# Patient Record
Sex: Female | Born: 1955 | Race: White | Hispanic: No | Marital: Married | Smoking: Never smoker
Health system: Southern US, Community
[De-identification: ages and names within clinical notes are randomized; demographics above are authoritative.]

## PROBLEM LIST (undated history)

## (undated) DIAGNOSIS — T7840XA Allergy, unspecified, initial encounter: Secondary | ICD-10-CM

## (undated) DIAGNOSIS — Z780 Asymptomatic menopausal state: Secondary | ICD-10-CM

## (undated) DIAGNOSIS — F988 Other specified behavioral and emotional disorders with onset usually occurring in childhood and adolescence: Secondary | ICD-10-CM

## (undated) DIAGNOSIS — E663 Overweight: Secondary | ICD-10-CM

## (undated) DIAGNOSIS — M797 Fibromyalgia: Secondary | ICD-10-CM

## (undated) HISTORY — PX: MYOMECTOMY: SHX85

## (undated) HISTORY — DX: Other specified behavioral and emotional disorders with onset usually occurring in childhood and adolescence: F98.8

## (undated) HISTORY — DX: Overweight: E66.3

## (undated) HISTORY — DX: Asymptomatic menopausal state: Z78.0

## (undated) HISTORY — DX: Allergy, unspecified, initial encounter: T78.40XA

## (undated) HISTORY — DX: Fibromyalgia: M79.7

## (undated) HISTORY — PX: OTHER SURGICAL HISTORY: SHX169

---

## 1999-09-01 ENCOUNTER — Other Ambulatory Visit: Admission: RE | Admit: 1999-09-01 | Discharge: 1999-09-01 | Payer: Self-pay | Admitting: Obstetrics and Gynecology

## 1999-11-06 ENCOUNTER — Encounter: Admission: RE | Admit: 1999-11-06 | Discharge: 1999-11-06 | Payer: Self-pay | Admitting: *Deleted

## 2001-01-19 ENCOUNTER — Other Ambulatory Visit: Admission: RE | Admit: 2001-01-19 | Discharge: 2001-01-19 | Payer: Self-pay | Admitting: Obstetrics and Gynecology

## 2001-01-25 ENCOUNTER — Encounter: Payer: Self-pay | Admitting: Obstetrics and Gynecology

## 2001-01-25 ENCOUNTER — Encounter: Admission: RE | Admit: 2001-01-25 | Discharge: 2001-01-25 | Payer: Self-pay | Admitting: Obstetrics and Gynecology

## 2002-03-22 ENCOUNTER — Encounter: Payer: Self-pay | Admitting: Obstetrics and Gynecology

## 2002-03-22 ENCOUNTER — Encounter: Admission: RE | Admit: 2002-03-22 | Discharge: 2002-03-22 | Payer: Self-pay | Admitting: Obstetrics and Gynecology

## 2002-05-30 ENCOUNTER — Other Ambulatory Visit: Admission: RE | Admit: 2002-05-30 | Discharge: 2002-05-30 | Payer: Self-pay | Admitting: Obstetrics and Gynecology

## 2003-03-14 ENCOUNTER — Ambulatory Visit (HOSPITAL_COMMUNITY): Admission: RE | Admit: 2003-03-14 | Discharge: 2003-03-14 | Payer: Self-pay | Admitting: *Deleted

## 2004-01-21 ENCOUNTER — Encounter: Admission: RE | Admit: 2004-01-21 | Discharge: 2004-01-21 | Payer: Self-pay | Admitting: Obstetrics and Gynecology

## 2004-02-11 ENCOUNTER — Other Ambulatory Visit: Admission: RE | Admit: 2004-02-11 | Discharge: 2004-02-11 | Payer: Self-pay | Admitting: Obstetrics and Gynecology

## 2005-03-25 ENCOUNTER — Encounter: Admission: RE | Admit: 2005-03-25 | Discharge: 2005-03-25 | Payer: Self-pay | Admitting: Obstetrics and Gynecology

## 2005-07-29 ENCOUNTER — Other Ambulatory Visit: Admission: RE | Admit: 2005-07-29 | Discharge: 2005-07-29 | Payer: Self-pay | Admitting: Obstetrics and Gynecology

## 2006-03-29 ENCOUNTER — Encounter: Admission: RE | Admit: 2006-03-29 | Discharge: 2006-03-29 | Payer: Self-pay | Admitting: Obstetrics and Gynecology

## 2007-06-29 ENCOUNTER — Encounter: Admission: RE | Admit: 2007-06-29 | Discharge: 2007-06-29 | Payer: Self-pay | Admitting: Obstetrics and Gynecology

## 2010-06-29 ENCOUNTER — Encounter: Admission: RE | Admit: 2010-06-29 | Discharge: 2010-06-29 | Payer: Self-pay | Admitting: Obstetrics and Gynecology

## 2010-10-18 ENCOUNTER — Encounter: Payer: Self-pay | Admitting: Obstetrics and Gynecology

## 2011-04-08 ENCOUNTER — Ambulatory Visit (INDEPENDENT_AMBULATORY_CARE_PROVIDER_SITE_OTHER): Payer: PRIVATE HEALTH INSURANCE | Admitting: Internal Medicine

## 2011-04-08 ENCOUNTER — Other Ambulatory Visit: Payer: Self-pay | Admitting: Internal Medicine

## 2011-04-08 ENCOUNTER — Encounter: Payer: Self-pay | Admitting: Internal Medicine

## 2011-04-08 VITALS — BP 122/78 | HR 81 | Temp 98.5°F | Resp 12 | Ht 66.75 in | Wt 172.0 lb

## 2011-04-08 DIAGNOSIS — IMO0001 Reserved for inherently not codable concepts without codable children: Secondary | ICD-10-CM

## 2011-04-08 DIAGNOSIS — N951 Menopausal and female climacteric states: Secondary | ICD-10-CM

## 2011-04-08 DIAGNOSIS — M797 Fibromyalgia: Secondary | ICD-10-CM

## 2011-04-08 DIAGNOSIS — Z780 Asymptomatic menopausal state: Secondary | ICD-10-CM

## 2011-04-08 DIAGNOSIS — F988 Other specified behavioral and emotional disorders with onset usually occurring in childhood and adolescence: Secondary | ICD-10-CM

## 2011-04-08 LAB — LIPID PANEL
HDL: 69 mg/dL (ref >39)
Total CHOL/HDL Ratio: 3 Ratio
VLDL: 28 mg/dL (ref 0–40)

## 2011-04-08 LAB — CBC WITH DIFFERENTIAL/PLATELET
Basophils Relative: 1 % (ref 0–1)
HCT: 41.9 % (ref 36.0–46.0)
Hemoglobin: 14.7 g/dL (ref 12.0–15.0)
Lymphs Abs: 1.4 10*3/uL (ref 0.7–4.0)
MCHC: 35.1 g/dL (ref 30.0–36.0)
MCV: 90.3 fL (ref 78.0–100.0)
Monocytes Relative: 9 % (ref 3–12)
Neutro Abs: 2.5 10*3/uL (ref 1.7–7.7)
Neutrophils Relative %: 55 % (ref 43–77)
Platelets: 238 10*3/uL (ref 150–400)
RBC: 4.64 MIL/uL (ref 3.87–5.11)
RDW: 12.9 % (ref 11.5–15.5)
WBC: 4.6 10*3/uL (ref 4.0–10.5)

## 2011-04-08 NOTE — Progress Notes (Signed)
Subjective:     Patient ID: Alyssa Nichols, female   DOB: [birth year], 55 y.o.   MRN: [id number]  HPI  New pt first visit.  Former pt of Dr. Hyacinth Meeker, Dr. Arelia Sneddon, Dr. Link Snuffer and Dr. Toni Arthurs.  Concerned over several issues.  Chronic fatigue, weight gain.  Out of insurance for quite a while and has not been able to stay current on preventive care.  Reports being diagnosed with ADD in past but not on Adderall right now   Originally diagnosed with Dr. Jodi Marble or Dr. Otelia Santee.  Not sure if thyroid is causing wt. Gain  Multiple emotional stressors,  Husband unemployed and needs aortic valve replacement next week.  She works part time as an Occupational psychologist.  Lives with 55 yo down's syndrome sister who is having memory problems.  Also has 55 yo.  Reports she has no support system.  Cannot afford a counselor right now  Denies suicidal or homicidal ideation.  Repeatedly tells me she does not feel depressed, just "tired"  Sleeping OK  Appetitie OK but gaining weight.   Review of Systems  See HPI     Objective:   Physical Exam  Constitutional: She appears well-developed and well-nourished.  Neck: No thyromegaly present.  Cardiovascular: Normal rate and regular rhythm.  Exam reveals no gallop and no friction rub.   No murmur heard.      No LE edema  Pulmonary/Chest: Breath sounds normal. She has no wheezes. She has no rales.       Assessment:     1)  Fatigue, wt gain 2)  History of fibromyalgia 3) history of ADD 4)  Multiple emotional stressors    Plan:     Will check baseline labs wilth thyroid.  Needs CPE - pt to schedule.    UTD on mamogram  2)   I gave pt number to 3 different psychiatrists for diagnostic evaluation.  She would also benefit from counseling but financially not feasible at this point

## 2011-04-09 LAB — COMPLETE METABOLIC PANEL WITH GFR
Albumin: 4.6 g/dL (ref 3.5–5.2)
Calcium: 10 mg/dL (ref 8.4–10.5)
Chloride: 102 mEq/L (ref 96–112)
GFR, Est African American: >60 mL/min (ref >60)
Potassium: 4.2 mEq/L (ref 3.5–5.3)
Total Bilirubin: 0.8 mg/dL (ref 0.3–1.2)

## 2011-04-27 ENCOUNTER — Inpatient Hospital Stay (HOSPITAL_COMMUNITY): Admission: RE | Admit: 2011-04-27 | Payer: Self-pay | Source: Ambulatory Visit

## 2011-04-27 ENCOUNTER — Other Ambulatory Visit: Payer: Self-pay | Admitting: Internal Medicine

## 2011-04-27 ENCOUNTER — Ambulatory Visit (INDEPENDENT_AMBULATORY_CARE_PROVIDER_SITE_OTHER): Payer: PRIVATE HEALTH INSURANCE | Admitting: Internal Medicine

## 2011-04-27 ENCOUNTER — Encounter: Payer: Self-pay | Admitting: Internal Medicine

## 2011-04-27 VITALS — BP 106/59 | HR 74 | Temp 98.8°F | Resp 12 | Ht 66.75 in | Wt 173.0 lb

## 2011-04-27 DIAGNOSIS — Z113 Encounter for screening for infections with a predominantly sexual mode of transmission: Secondary | ICD-10-CM

## 2011-04-27 DIAGNOSIS — N951 Menopausal and female climacteric states: Secondary | ICD-10-CM

## 2011-04-27 DIAGNOSIS — IMO0001 Reserved for inherently not codable concepts without codable children: Secondary | ICD-10-CM

## 2011-04-27 DIAGNOSIS — Z23 Encounter for immunization: Secondary | ICD-10-CM

## 2011-04-27 DIAGNOSIS — Z01419 Encounter for gynecological examination (general) (routine) without abnormal findings: Secondary | ICD-10-CM

## 2011-04-27 DIAGNOSIS — M797 Fibromyalgia: Secondary | ICD-10-CM

## 2011-04-27 DIAGNOSIS — Z780 Asymptomatic menopausal state: Secondary | ICD-10-CM | POA: Insufficient documentation

## 2011-04-27 DIAGNOSIS — Z1272 Encounter for screening for malignant neoplasm of vagina: Secondary | ICD-10-CM

## 2011-04-27 DIAGNOSIS — E663 Overweight: Secondary | ICD-10-CM

## 2011-04-27 LAB — POCT URINALYSIS DIPSTICK
Blood, UA: NEGATIVE
Ketones, UA: NEGATIVE
Nitrite, UA: NEGATIVE
Protein, UA: NEGATIVE

## 2011-04-27 NOTE — Progress Notes (Signed)
Subjective:    Patient ID: Alyssa Nichols, female    DOB: 1956/05/20, 55 y.o.   MRN: [id number]  HPI  Alyssa Nichols is here for comprehensive evaluation.  Overall doing well.  Her husband is S/P 2 weeks from aortic valve surgery.  She is behind on health maintenance except for mammogram which was negative and not due until 10/12.  She does not report any masses or concerns on self breast exam  She has not had her first colonoscopy.  No blood or stools noted by pt.  No Known Allergies Past Medical History  Diagnosis Date  . Menopause   . ADD (attention deficit disorder)     per pt history  . Fibromyalgia    Past Surgical History  Procedure Date  . Myomectomy 1997   History   Social History  . Marital Status: Married    Spouse Name: N/A    Number of Children: 1  . Years of Education: Bachelor's   Occupational History  . TEACHER    Social History Main Topics  . Smoking status: Never Smoker   . Smokeless tobacco: Not on file  . Alcohol Use: 0.5 oz/week    1 drink(s) per week  . Drug Use: No  . Sexually Active: Yes -- Female partner(s)   Other Topics Concern  . Not on file   Social History Narrative  . No narrative on file   Family History  Problem Relation Age of Onset  . Down syndrome Sister   . Cancer Sister     breast  . Osteoporosis Mother   . Arthritis Mother   . Diabetes Father   . Hypertension Father    Patient Active Problem List  Diagnoses  . Fibromyalgia  . Menopause   Current Outpatient Prescriptions on File Prior to Visit  Medication Sig Dispense Refill  . Cholecalciferol (VITAMIN D3) 3000 UNITS TABS Take 1 tablet by mouth daily.        . Cyanocobalamin (VITAMIN B-12) 5000 MCG SUBL Place 1 tablet under the tongue daily.        Marland Kitchen FLUoxetine (PROZAC) 20 MG tablet Take 20 mg by mouth daily.       Marland Kitchen loratadine (CLARITIN) 10 MG tablet Take 10 mg by mouth daily.        . Multiple Vitamin (MULTIVITAMIN) tablet Take 1 tablet by mouth daily.               Review of Systems  No chest pain,no SOB Appetite good.  Some weight gain No hot flushes     Objective:   Physical Exam Physical Exam  Vital signs and nursing note reviewed  Constitutional: She is oriented to person, place, and time. She appears well-developed and well-nourished. She is cooperative.  HENT:  Head: Normocephalic and atraumatic.  Right Ear: Tympanic membrane normal.  Left Ear: Tympanic membrane normal.  Nose: Nose normal.  Mouth/Throat: Oropharynx is clear and moist and mucous membranes are normal. No oropharyngeal exudate or posterior oropharyngeal erythema.  Eyes: Conjunctivae and EOM are normal. Pupils are equal, round, and reactive to light.  Neck: Neck supple. No JVD present. Carotid bruit is not present. No mass and no thyromegaly present.  Cardiovascular: Regular rhythm, normal heart sounds, intact distal pulses and normal pulses.  Exam reveals no gallop and no friction rub.   No murmur heard. Pulses:      Dorsalis pedis pulses are 2+ on the right side, and 2+ on the left side.  Pulmonary/Chest: Breath sounds normal.  She has no wheezes. She has no rhonchi. She has no rales. Right breast exhibits no mass, no nipple discharge and no skin change. Left breast exhibits no mass, no nipple discharge and no skin change.  Abdominal: Soft. Bowel sounds are normal. She exhibits no distension and no mass. There is no hepatosplenomegaly. There is no tenderness. There is no CVA tenderness.  Genitourinary: Rectum normal, vagina normal and uterus normal. Rectal exam shows no mass. Guaiac negative stool. No labial fusion. There is no lesion on the right labia. There is no lesion on the left labia. Cervix exhibits no motion tenderness. Right adnexum displays no mass, no tenderness and no fullness. Left adnexum displays no mass, no tenderness and no fullness. No erythema around the vagina.  Musculoskeletal:       No active synovitis to any joint.    Lymphadenopathy:        Right cervical: No superficial cervical adenopathy present.      Left cervical: No superficial cervical adenopathy present.       Right axillary: No pectoral and no lateral adenopathy present.       Left axillary: No pectoral and no lateral adenopathy present.      Right: No inguinal adenopathy present.       Left: No inguinal adenopathy present.  Neurological: She is alert and oriented to person, place, and time. She has normal strength and normal reflexes. No cranial nerve deficit or sensory deficit. She displays a negative Romberg sign. Coordination and gait normal.  Skin: Skin is warm and dry. No abrasion, no bruising, no ecchymosis and no rash noted. No cyanosis. Nails show no clubbing.  Psychiatric: She has a normal mood and affect. Her speech is normal and behavior is normal.          Assessment & Plan:  1)  Health Maintenance  Will give TDAP today.  Refer to GI for colonoscopy and set up for bone density.  Mammogram neg 10/11     See scanned HM sheet   Info given for Zoster vaccine  2)  Wt gain  Will refer to nutriiton for wt loss counseling  3)  Situational anxiety.  Pt states she will call Dr. Evelene Croon and for further diagnostic evaluatoion  I spent 45 minutes with this pt       Assessment & Plan:

## 2011-04-27 NOTE — Patient Instructions (Signed)
Referral to Dr. Matthias Hughs for colonoscopy  Will set up bone density.  Follow up as needed

## 2011-04-29 ENCOUNTER — Encounter: Payer: Self-pay | Admitting: Gastroenterology

## 2011-04-30 LAB — VITAMIN D 1,25 DIHYDROXY
Vitamin D 1, 25 (OH)2 Total: 49 pg/mL (ref 18–72)
Vitamin D2 1, 25 (OH)2: <8 pg/mL

## 2011-04-30 NOTE — Progress Notes (Signed)
Pt referred to Dr Arlyce Dice ([facility]) d/t insurance- [facility] is in her network.  Pt's has RN assessment appointment scheduled 06/08/11 @ 1:00pm and colonoscopy appointment scheduled 06/22/11 @ 10:30am.  Pt is aware of both appointments per K. Harvell-dhp, rn

## 2011-05-05 ENCOUNTER — Encounter: Payer: Self-pay | Admitting: Emergency Medicine

## 2011-05-27 ENCOUNTER — Encounter: Payer: Self-pay | Admitting: *Deleted

## 2011-05-27 ENCOUNTER — Encounter: Payer: PRIVATE HEALTH INSURANCE | Attending: Internal Medicine | Admitting: *Deleted

## 2011-05-27 DIAGNOSIS — Z713 Dietary counseling and surveillance: Secondary | ICD-10-CM | POA: Insufficient documentation

## 2011-05-27 DIAGNOSIS — E663 Overweight: Secondary | ICD-10-CM | POA: Insufficient documentation

## 2011-05-27 NOTE — Progress Notes (Signed)
  Medical Nutrition Therapy:  Appt start time: 10:30a   End time:  11:30a.  Assessment:  Primary concerns today: Overweight.  Pt here for assessment of overweight and help with weight management. States she started gaining weight last year after starting menopause and is very frustrated as she has not changed any dietary or exercise habits. Food recall reveals inadequate energy intake for highly active lifestyle. Pt exercises 5 days/wk for 45-60 min/day; current energy intake ~1150-1200 cal/d. Calorie count reveals increased CHO at some meals. May be adding to inability to lose weight along with hormonal changes from menopause. Overall, excessive CHO and decreased protein intake noted.     MEDICATIONS: TUMS, Vit D3, Vit B12, Fluoxetine, Claritin, MVI   DIETARY INTAKE:  Usual eating pattern includes 3 meals and 1-2 snacks per day.  24-hr recall:  B (AM): 1 cup Kashi cereal w/ 2% milk (.5 c), small pc of fruit; water  Snk (AM): cheese stick, fiber one bar, or luna bar    L (PM): [place] yogurt (flavored), a peanut butter cracker (wheat), banana; water Snk (PM): None D (PM): Salad w/ black eyed peas, goat cheese, vinagerette or Ken's Lite [place] Onion; apple, water Snk (PM): Very sm amount of dark chocolate or 1/3 of Klondike bar  Usual physical activity:  2 days/wk - spin class (45 min); other 3 days - pilates or yoga (60 min) + some elliptical  Estimated energy needs: 1200-1400 calories 150-175 g carbohydrates 60-70 g protein 40-45 g fat  Progress Towards Goal(s):  NEW.   Nutritional Diagnosis: [place]-3.3 Overweight related to inadequate PO intake as evidenced by patient reported 24-hour food recall and highly active lifestyle.    Intervention/Goals:  Follow yellow card with meal plan as instructed.  Limit carbohydrate to 30-45 grams/meal.  Have protein rich foods with all meals and snacks.  Continue to choose whole grains, lean protein, low-fat dairy, and fruits/non-starchy  vegetables.   Aim for 60 min of moderate physical activity daily.  Limit sugar-sweetened beverages and concentrated sweets.  Daily Nutrition Recommendations:  1200-1400 calories  150-175 g carbohydrate  75-85 g protein  35-40 g fat (<12-15 gm as saturated fat)  < 2400 mg sodium (Note: if diagnosed with high blood pressure = < 2000 mg)  < 200 mg dietary cholesterol  25 g fiber  Monitoring/Evaluation:  Dietary intake, exercise, and body weight prn.

## 2011-05-27 NOTE — Patient Instructions (Addendum)
Goals:  Follow yellow card with meal plan as instructed.  Limit carbohydrate to 30-45 grams/meal.  Have protein rich foods with all meals and snacks.  Continue to choose whole grains, lean protein, low-fat dairy, and fruits/non-starchy vegetables.   Aim for 60 min of moderate physical activity daily.  Limit sugar-sweetened beverages and concentrated sweets.  Daily Nutrition Recommendations:  1200-1400 calories  150-175 g carbohydrate  75-85 g protein  35-40 g fat (<12-15 gm as saturated fat)  < 2400 mg sodium (Note: if diagnosed with high blood pressure = < 2000 mg)  < 200 mg dietary cholesterol  25 g fiber

## 2011-05-28 ENCOUNTER — Encounter: Payer: Self-pay | Admitting: *Deleted

## 2011-06-02 ENCOUNTER — Other Ambulatory Visit: Payer: Self-pay | Admitting: Internal Medicine

## 2011-06-02 DIAGNOSIS — Z1231 Encounter for screening mammogram for malignant neoplasm of breast: Secondary | ICD-10-CM

## 2011-06-04 ENCOUNTER — Encounter: Payer: Self-pay | Admitting: Emergency Medicine

## 2011-06-08 ENCOUNTER — Ambulatory Visit (AMBULATORY_SURGERY_CENTER): Payer: PRIVATE HEALTH INSURANCE

## 2011-06-08 VITALS — Ht 67.0 in | Wt 171.4 lb

## 2011-06-08 DIAGNOSIS — Z1211 Encounter for screening for malignant neoplasm of colon: Secondary | ICD-10-CM

## 2011-06-08 MED ORDER — NA SULFATE-K SULFATE-MG SULF 17.5-3.13-1.6 GM/177ML PO SOLN: 1.0000 | Freq: Once | ORAL | Status: DC

## 2011-06-22 ENCOUNTER — Other Ambulatory Visit: Payer: PRIVATE HEALTH INSURANCE | Admitting: Gastroenterology

## 2011-06-28 ENCOUNTER — Other Ambulatory Visit: Payer: PRIVATE HEALTH INSURANCE | Admitting: Gastroenterology

## 2011-07-01 ENCOUNTER — Other Ambulatory Visit: Payer: PRIVATE HEALTH INSURANCE

## 2011-07-01 ENCOUNTER — Ambulatory Visit: Payer: PRIVATE HEALTH INSURANCE

## 2011-07-09 ENCOUNTER — Ambulatory Visit: Payer: PRIVATE HEALTH INSURANCE

## 2011-07-09 ENCOUNTER — Other Ambulatory Visit: Payer: PRIVATE HEALTH INSURANCE

## 2011-07-29 ENCOUNTER — Ambulatory Visit
Admission: RE | Admit: 2011-07-29 | Discharge: 2011-07-29 | Disposition: A | Discharge status: Home / Self Care | Payer: PRIVATE HEALTH INSURANCE | Source: Ambulatory Visit | Attending: Internal Medicine | Admitting: Internal Medicine

## 2011-07-29 DIAGNOSIS — Z780 Asymptomatic menopausal state: Secondary | ICD-10-CM

## 2011-07-29 DIAGNOSIS — Z1231 Encounter for screening mammogram for malignant neoplasm of breast: Secondary | ICD-10-CM

## 2011-08-01 ENCOUNTER — Encounter: Payer: Self-pay | Admitting: Internal Medicine

## 2011-08-01 DIAGNOSIS — M8580 Other specified disorders of bone density and structure, unspecified site: Secondary | ICD-10-CM | POA: Insufficient documentation

## 2011-08-02 ENCOUNTER — Encounter: Payer: Self-pay | Admitting: Emergency Medicine

## 2011-08-09 ENCOUNTER — Telehealth: Payer: Self-pay | Admitting: Gastroenterology

## 2011-08-09 ENCOUNTER — Telehealth: Payer: Self-pay | Admitting: *Deleted

## 2011-08-09 DIAGNOSIS — Z1211 Encounter for screening for malignant neoplasm of colon: Secondary | ICD-10-CM

## 2011-08-09 MED ORDER — PEG-KCL-NACL-NASULF-NA ASC-C 100 G PO SOLR: ORAL | Status: DC

## 2011-08-09 NOTE — Telephone Encounter (Signed)
New prep instructions sent to pharmacist at [facility] for Movi Prep.

## 2011-08-11 NOTE — Telephone Encounter (Signed)
See other note on 08/09/11

## 2011-08-16 ENCOUNTER — Ambulatory Visit (AMBULATORY_SURGERY_CENTER): Payer: PRIVATE HEALTH INSURANCE | Admitting: Gastroenterology

## 2011-08-16 ENCOUNTER — Encounter: Payer: Self-pay | Admitting: Gastroenterology

## 2011-08-16 VITALS — BP 120/72 | HR 61 | Temp 97.7°F | Resp 14 | Ht 67.0 in | Wt 171.0 lb

## 2011-08-16 DIAGNOSIS — Z1211 Encounter for screening for malignant neoplasm of colon: Secondary | ICD-10-CM

## 2011-08-16 MED ORDER — SODIUM CHLORIDE 0.9 % IV SOLN: 500.0000 mL | INTRAVENOUS | Status: DC

## 2011-08-16 NOTE — Progress Notes (Signed)
Patient did not experience any of the following events: a burn prior to discharge; a fall within the facility; wrong site/side/patient/procedure/implant event; or a hospital transfer or hospital admission upon discharge from the facility. (G8907) Patient did not have preoperative order for IV antibiotic SSI prophylaxis. (G8918)  

## 2011-08-17 ENCOUNTER — Telehealth: Payer: Self-pay | Admitting: *Deleted

## 2011-08-17 NOTE — Telephone Encounter (Signed)
No answer, message left for the patient. 

## 2013-03-27 ENCOUNTER — Other Ambulatory Visit: Payer: Self-pay

## 2013-03-27 DIAGNOSIS — Z1231 Encounter for screening mammogram for malignant neoplasm of breast: Secondary | ICD-10-CM

## 2013-04-23 ENCOUNTER — Ambulatory Visit
Admission: RE | Admit: 2013-04-23 | Discharge: 2013-04-23 | Disposition: A | Discharge status: Home / Self Care | Payer: BC Managed Care – PPO | Source: Ambulatory Visit

## 2013-04-23 DIAGNOSIS — Z1231 Encounter for screening mammogram for malignant neoplasm of breast: Secondary | ICD-10-CM

## 2013-06-03 ENCOUNTER — Emergency Department (HOSPITAL_COMMUNITY)
Admission: EM | Admit: 2013-06-03 | Discharge: 2013-06-03 | Disposition: A | Discharge status: Home / Self Care | Payer: BC Managed Care – PPO | Attending: Emergency Medicine | Admitting: Emergency Medicine

## 2013-06-03 ENCOUNTER — Encounter (HOSPITAL_COMMUNITY): Payer: Self-pay | Admitting: Emergency Medicine

## 2013-06-03 ENCOUNTER — Emergency Department (HOSPITAL_COMMUNITY): Payer: BC Managed Care – PPO

## 2013-06-03 DIAGNOSIS — S0180XA Unspecified open wound of other part of head, initial encounter: Secondary | ICD-10-CM | POA: Insufficient documentation

## 2013-06-03 DIAGNOSIS — N951 Menopausal and female climacteric states: Secondary | ICD-10-CM | POA: Insufficient documentation

## 2013-06-03 DIAGNOSIS — S0181XA Laceration without foreign body of other part of head, initial encounter: Secondary | ICD-10-CM

## 2013-06-03 DIAGNOSIS — Z9109 Other allergy status, other than to drugs and biological substances: Secondary | ICD-10-CM | POA: Insufficient documentation

## 2013-06-03 DIAGNOSIS — IMO0001 Reserved for inherently not codable concepts without codable children: Secondary | ICD-10-CM | POA: Insufficient documentation

## 2013-06-03 DIAGNOSIS — R51 Headache: Secondary | ICD-10-CM | POA: Insufficient documentation

## 2013-06-03 DIAGNOSIS — E663 Overweight: Secondary | ICD-10-CM | POA: Insufficient documentation

## 2013-06-03 DIAGNOSIS — F988 Other specified behavioral and emotional disorders with onset usually occurring in childhood and adolescence: Secondary | ICD-10-CM | POA: Insufficient documentation

## 2013-06-03 DIAGNOSIS — Y929 Unspecified place or not applicable: Secondary | ICD-10-CM | POA: Insufficient documentation

## 2013-06-03 DIAGNOSIS — W010XXA Fall on same level from slipping, tripping and stumbling without subsequent striking against object, initial encounter: Secondary | ICD-10-CM | POA: Insufficient documentation

## 2013-06-03 DIAGNOSIS — Y939 Activity, unspecified: Secondary | ICD-10-CM | POA: Insufficient documentation

## 2013-06-03 DIAGNOSIS — IMO0002 Reserved for concepts with insufficient information to code with codable children: Secondary | ICD-10-CM | POA: Insufficient documentation

## 2013-06-03 DIAGNOSIS — Z79899 Other long term (current) drug therapy: Secondary | ICD-10-CM | POA: Insufficient documentation

## 2013-06-03 DIAGNOSIS — Z6825 Body mass index (BMI) 25.0-25.9, adult: Secondary | ICD-10-CM | POA: Insufficient documentation

## 2013-06-03 MED ORDER — HYDROCODONE-ACETAMINOPHEN 5-325 MG PO TABS: 2.0000 | ORAL_TABLET | ORAL | Status: AC | PRN

## 2013-06-03 MED ORDER — HYDROCODONE-ACETAMINOPHEN 5-325 MG PO TABS
1.0000 | ORAL_TABLET | Freq: Once | ORAL | Status: AC
  Administered 2013-06-03: 1 via ORAL
  Filled 2013-06-03: qty 1

## 2013-06-03 MED ORDER — CEPHALEXIN 500 MG PO CAPS: 500.0000 mg | ORAL_CAPSULE | Freq: Four times a day (QID) | ORAL | Status: AC

## 2013-06-03 NOTE — Consult Note (Signed)
Reason for Consult: Facial laceration Referring Physician: Jaci Carrel, MD  HPI:  Alyssa Nichols is an 57 y.o. female who presented to the ER today after a fall. Patient reports that she slipped on a slippery floor and hit the left side of her forehead on a door jam. No loss of consciousness. Patient reports a large laceration in the area with mild to moderate pain. No neck pain, back pain, numbness or tingling, or visual change.  Her CT scan is negative for facial fracture.   Past Medical History  Diagnosis Date  . Menopause   . ADD (attention deficit disorder)     per pt history  . Fibromyalgia   . Fibromyalgia   . Overweight (BMI 25.0-29.9)   . Allergy     Past Surgical History  Procedure Laterality Date  . Myomectomy      For uterine fibroids  . Ivf      Family History  Problem Relation Age of Onset  . Cancer Sister     breast  . Down syndrome Sister   . Breast cancer Sister   . Osteoporosis Mother   . Arthritis Mother   . Diabetes Father   . Hypertension Father   . Diverticulitis Father   . Heart attack Father   . Colon cancer Neg Hx     Social History:  reports that she has never smoked. She has never used smokeless tobacco. She reports that she drinks about 0.5 ounces of alcohol per week. She reports that she does not use illicit drugs.  Allergies: No Known Allergies  Home Medications    Current Outpatient Rx   Name   Route   Sig   Dispense   Refill   .  amphetamine-dextroamphetamine (ADDERALL, 20MG ,) 20 MG tablet   Oral   Take 20 mg by mouth daily. Take 2 pillls daily       .  calcium carbonate (TUMS - DOSED IN MG ELEMENTAL CALCIUM) 500 MG chewable tablet   Oral   Chew 1 tablet by mouth 3 (three) times daily.       .  Cholecalciferol (VITAMIN D3) 3000 UNITS TABS   Oral   Take 1 tablet by mouth daily.       .  Cyanocobalamin (VITAMIN B-12) 5000 MCG SUBL   Sublingual   Place 1 tablet under the tongue daily.       Marland Kitchen  FLUoxetine (PROZAC) 20 MG tablet    Oral   Take 20 mg by mouth daily.       Marland Kitchen  loratadine (CLARITIN) 10 MG tablet   Oral   Take 10 mg by mouth daily.       .  Multiple Vitamin (MULTIVITAMIN) tablet   Oral   Take 1 tablet by mouth daily.           No results found for this or any previous visit (from the past 48 hour(s)).  Ct Head Wo Contrast  06/03/2013   *RADIOLOGY REPORT*  Clinical Data: History of fall with head injury.  CT HEAD WITHOUT CONTRAST  Technique:  Contiguous axial images were obtained from the base of the skull through the vertex without contrast.  Comparison: No priors.  Findings: There appears to be a small soft tissue laceration in the left frontal scalp. No acute displaced skull fractures are identified.  No acute intracranial abnormality.  Specifically, no evidence of acute post-traumatic intracranial hemorrhage, no definite regions of acute/subacute cerebral ischemia, no focal mass, mass effect, hydrocephalus  or abnormal intra or extra-axial fluid collections.  The visualized paranasal sinuses and mastoids are well pneumatized.  IMPRESSION: 1.  Small left frontal scalp laceration without evidence of underlying acute displaced skull fracture or significant acute traumatic injury to the brain.   Original Report Authenticated By: Trudie Reed, M.D.   Review of Systems  Skin: Positive for wound.  Neurological: Positive for headaches.  All other systems are negative except as noted above.  Blood pressure 120/78, pulse 90, temperature 98.4 F (36.9 C), temperature source Oral, resp. rate 20, SpO2 100.00%. Physical Exam  Constitutional: She is oriented to person, place, and time. She appears well-developed and well-nourished. No distress.  HENT:  Head: Normocephalic and atraumatic. Facial laceration as noted, crossing the left eyebrow   Right Ear: Hearing normal.  Left Ear: Hearing normal.  Nose: Nose normal.  Mouth/Throat: Oropharynx is clear and moist and mucous membranes are normal.  Eyes: Conjunctivae  and EOM are normal. Pupils are equal, round, and reactive to light.  Neck: Normal range of motion. Neck supple.  Pulmonary/Chest: Effort normal and breath sounds normal. No respiratory distress. She exhibits no tenderness.  Musculoskeletal: Normal range of motion.  Neurological: She is alert and oriented to person, place, and time. She has normal strength. No cranial nerve deficit or sensory deficit. Coordination normal. GCS eye subscore is 4. GCS verbal subscore is 5. GCS motor subscore is 6.  Skin: Skin is warm and dry. Laceration noted (3 cm). No rash noted. No cyanosis.  Psychiatric: She has a normal mood and affect. Her speech is normal and behavior is normal. Thought content normal.   Procedure: Complex repair of left eyebrow laceration (3cm) Anesthesia: Local anesthesia with 1% lidocaine with 1:100,000 epinephrine Description: The patient is placed supine on the operating table. The left forehead and eyebrow are prepped and draped in a sterile fashion.  After adequate local anesthesia is achieved, the laceration site is carefully debrided. Sft tissue undermining is performed to release the skin tension. The laceration is closed in layers with interrupted sutures (4-0 vicryl and 5-0 prolene).  The patient tolerated the procedure well.    Assessment/Plan: Full thickness left forehead and eyebrow laceration.  The laceration is closed under local anesthesia in the emergency room. The patient tolerated the procedure well. The patient will be discharged home on oral antibiotics and Vicodin. She will followup in my office in one week for suture removal.  Lorianne Malbrough,SUI W 06/03/2013, 3:12 PM

## 2013-06-03 NOTE — ED Notes (Signed)
Pt. Stated, floor was sleek from wax and i slid and hit the door jam with my forehead

## 2013-06-03 NOTE — ED Provider Notes (Signed)
CSN: [id number]     Arrival date & time 06/03/13  1226 History   First MD Initiated Contact with Patient 06/03/13 1243     Chief Complaint  Patient presents with  . Facial Laceration   (Consider location/radiation/quality/duration/timing/severity/associated sxs/prior Treatment) HPI Comments: Patient presents to the ER after a fall. Patient reports that she slipped on a slippery floor and hit the left side of her forehead on a door jam. No loss of consciousness. Patient reports a large laceration in the area with mild to moderate pain. No neck pain, back pain, numbness or tingling.   Past Medical History  Diagnosis Date  . Menopause   . ADD (attention deficit disorder)     per pt history  . Fibromyalgia   . Fibromyalgia   . Overweight (BMI 25.0-29.9)   . Allergy    Past Surgical History  Procedure Laterality Date  . Myomectomy      For uterine fibroids  . Ivf     Family History  Problem Relation Age of Onset  . Cancer Sister     breast  . Down syndrome Sister   . Breast cancer Sister   . Osteoporosis Mother   . Arthritis Mother   . Diabetes Father   . Hypertension Father   . Diverticulitis Father   . Heart attack Father   . Colon cancer Neg Hx    History  Substance Use Topics  . Smoking status: Never Smoker   . Smokeless tobacco: Never Used  . Alcohol Use: 0.5 oz/week    1 drink(s) per week   OB History   Grav Para Term Preterm Abortions TAB SAB Ect Mult Living                 Review of Systems  Skin: Positive for wound.  Neurological: Positive for headaches.    Allergies  Review of patient's allergies indicates no known allergies.  Home Medications   Current Outpatient Rx  Name  Route  Sig  Dispense  Refill  . amphetamine-dextroamphetamine (ADDERALL, 20MG ,) 20 MG tablet   Oral   Take 20 mg by mouth daily. Take 2 pillls daily          . calcium carbonate (TUMS - DOSED IN MG ELEMENTAL CALCIUM) 500 MG chewable tablet   Oral   Chew 1 tablet by mouth  3 (three) times daily.           . Cholecalciferol (VITAMIN D3) 3000 UNITS TABS   Oral   Take 1 tablet by mouth daily.           . Cyanocobalamin (VITAMIN B-12) 5000 MCG SUBL   Sublingual   Place 1 tablet under the tongue daily.          Marland Kitchen FLUoxetine (PROZAC) 20 MG tablet   Oral   Take 20 mg by mouth daily.          Marland Kitchen loratadine (CLARITIN) 10 MG tablet   Oral   Take 10 mg by mouth daily.           . Multiple Vitamin (MULTIVITAMIN) tablet   Oral   Take 1 tablet by mouth daily.            BP 120/78  Pulse 90  Temp(Src) 98.4 F (36.9 C) (Oral)  Resp 20  SpO2 100% Physical Exam  Constitutional: She is oriented to person, place, and time. She appears well-developed and well-nourished. No distress.  HENT:  Head: Normocephalic and atraumatic.  Right Ear: Hearing normal.  Left Ear: Hearing normal.  Nose: Nose normal.  Mouth/Throat: Oropharynx is clear and moist and mucous membranes are normal.  Eyes: Conjunctivae and EOM are normal. Pupils are equal, round, and reactive to light.  Neck: Normal range of motion. Neck supple.  Cardiovascular: Regular rhythm, S1 normal and S2 normal.  Exam reveals no gallop and no friction rub.   No murmur heard. Pulmonary/Chest: Effort normal and breath sounds normal. No respiratory distress. She exhibits no tenderness.  Abdominal: Soft. Normal appearance and bowel sounds are normal. There is no hepatosplenomegaly. There is no tenderness. There is no rebound, no guarding, no tenderness at McBurney's point and negative Murphy's sign. No hernia.  Musculoskeletal: Normal range of motion.  Neurological: She is alert and oriented to person, place, and time. She has normal strength. No cranial nerve deficit or sensory deficit. Coordination normal. GCS eye subscore is 4. GCS verbal subscore is 5. GCS motor subscore is 6.  Skin: Skin is warm and dry. Laceration noted. No rash noted. No cyanosis.  Psychiatric: She has a normal mood and  affect. Her speech is normal and behavior is normal. Thought content normal.    ED Course  Procedures (including critical care time) Labs Review Labs Reviewed - No data to display Imaging Review Ct Head Wo Contrast  06/03/2013   *RADIOLOGY REPORT*  Clinical Data: History of fall with head injury.  CT HEAD WITHOUT CONTRAST  Technique:  Contiguous axial images were obtained from the base of the skull through the vertex without contrast.  Comparison: No priors.  Findings: There appears to be a small soft tissue laceration in the left frontal scalp. No acute displaced skull fractures are identified.  No acute intracranial abnormality.  Specifically, no evidence of acute post-traumatic intracranial hemorrhage, no definite regions of acute/subacute cerebral ischemia, no focal mass, mass effect, hydrocephalus or abnormal intra or extra-axial fluid collections.  The visualized paranasal sinuses and mastoids are well pneumatized.  IMPRESSION: 1.  Small left frontal scalp laceration without evidence of underlying acute displaced skull fracture or significant acute traumatic injury to the brain.   Original Report Authenticated By: Trudie Reed, M.D.    MDM  Diagnosis: Facial laceration  Presents to ER after a fall the patient suffered a laceration over the left eyebrow. Orientation is and it is deep. A CT scan of the head was performed, no acute pathology other than the soft tissue injury. Patient is requesting plastic surgery. I did discuss the case with Doctor Suszanne Conners, on call for facial trauma. He has agreed to evaluate the patient and perform a laceration repair.    Gilda Crease, MD 06/03/13 [phone]

## 2013-06-03 NOTE — ED Notes (Signed)
Ice applied, No LOC.  Pt. Stated, I do feel a little dizzy

## 2014-05-08 ENCOUNTER — Other Ambulatory Visit: Payer: Self-pay

## 2014-05-08 DIAGNOSIS — Z1231 Encounter for screening mammogram for malignant neoplasm of breast: Secondary | ICD-10-CM

## 2014-05-10 ENCOUNTER — Ambulatory Visit
Admission: RE | Admit: 2014-05-10 | Discharge: 2014-05-10 | Disposition: A | Discharge status: Home / Self Care | Payer: BC Managed Care – PPO | Source: Ambulatory Visit

## 2014-05-10 DIAGNOSIS — Z1231 Encounter for screening mammogram for malignant neoplasm of breast: Secondary | ICD-10-CM

## 2015-04-21 ENCOUNTER — Other Ambulatory Visit: Payer: Self-pay

## 2015-04-21 DIAGNOSIS — Z1231 Encounter for screening mammogram for malignant neoplasm of breast: Secondary | ICD-10-CM

## 2015-05-28 ENCOUNTER — Ambulatory Visit
Admission: RE | Admit: 2015-05-28 | Discharge: 2015-05-28 | Disposition: A | Discharge status: Home / Self Care | Payer: BLUE CROSS/BLUE SHIELD | Source: Ambulatory Visit

## 2015-05-28 DIAGNOSIS — Z1231 Encounter for screening mammogram for malignant neoplasm of breast: Secondary | ICD-10-CM

## 2018-10-21 DIAGNOSIS — F3342 Major depressive disorder, recurrent, in full remission: Secondary | ICD-10-CM | POA: Diagnosis not present

## 2018-10-21 DIAGNOSIS — F900 Attention-deficit hyperactivity disorder, predominantly inattentive type: Secondary | ICD-10-CM | POA: Diagnosis not present

## 2019-05-04 DIAGNOSIS — F3342 Major depressive disorder, recurrent, in full remission: Secondary | ICD-10-CM | POA: Diagnosis not present

## 2019-05-04 DIAGNOSIS — F900 Attention-deficit hyperactivity disorder, predominantly inattentive type: Secondary | ICD-10-CM | POA: Diagnosis not present

## 2019-10-25 DIAGNOSIS — F4321 Adjustment disorder with depressed mood: Secondary | ICD-10-CM | POA: Diagnosis not present

## 2019-11-09 DIAGNOSIS — Z630 Problems in relationship with spouse or partner: Secondary | ICD-10-CM | POA: Diagnosis not present

## 2019-11-09 DIAGNOSIS — F4323 Adjustment disorder with mixed anxiety and depressed mood: Secondary | ICD-10-CM | POA: Diagnosis not present

## 2019-11-14 DIAGNOSIS — F4323 Adjustment disorder with mixed anxiety and depressed mood: Secondary | ICD-10-CM | POA: Diagnosis not present

## 2019-11-14 DIAGNOSIS — Z630 Problems in relationship with spouse or partner: Secondary | ICD-10-CM | POA: Diagnosis not present

## 2019-11-20 DIAGNOSIS — Z630 Problems in relationship with spouse or partner: Secondary | ICD-10-CM | POA: Diagnosis not present

## 2019-11-20 DIAGNOSIS — F4323 Adjustment disorder with mixed anxiety and depressed mood: Secondary | ICD-10-CM | POA: Diagnosis not present

## 2019-11-24 ENCOUNTER — Ambulatory Visit: Payer: BLUE CROSS/BLUE SHIELD | Attending: Internal Medicine

## 2019-11-24 DIAGNOSIS — Z23 Encounter for immunization: Secondary | ICD-10-CM

## 2019-11-24 NOTE — Progress Notes (Signed)
   Covid-19 Vaccination Clinic  Name:  KRISANNE LICH    MRN: [id number] DOB: [birth year]  11/24/2019  Ms. Mcpeters was observed post Covid-19 immunization for 15 minutes without incidence. She was provided with Vaccine Information Sheet and instruction to access the V-Safe system.   Ms. Carr was instructed to call 911 with any severe reactions post vaccine: Marland Kitchen Difficulty breathing  . Swelling of your face and throat  . A fast heartbeat  . A bad rash all over your body  . Dizziness and weakness    Immunizations Administered    Name Date Dose VIS Date Route   Pfizer COVID-19 Vaccine 11/24/2019  5:25 PM 0.3 mL 09/07/2019 Intramuscular   Manufacturer: [employer], [employer]   Lot: [id number]   NDC: [address]-2

## 2019-11-27 DIAGNOSIS — Z630 Problems in relationship with spouse or partner: Secondary | ICD-10-CM | POA: Diagnosis not present

## 2019-11-27 DIAGNOSIS — F4323 Adjustment disorder with mixed anxiety and depressed mood: Secondary | ICD-10-CM | POA: Diagnosis not present

## 2019-12-04 DIAGNOSIS — Z630 Problems in relationship with spouse or partner: Secondary | ICD-10-CM | POA: Diagnosis not present

## 2019-12-04 DIAGNOSIS — F4323 Adjustment disorder with mixed anxiety and depressed mood: Secondary | ICD-10-CM | POA: Diagnosis not present

## 2019-12-08 DIAGNOSIS — Z20828 Contact with and (suspected) exposure to other viral communicable diseases: Secondary | ICD-10-CM | POA: Diagnosis not present

## 2019-12-11 DIAGNOSIS — F4323 Adjustment disorder with mixed anxiety and depressed mood: Secondary | ICD-10-CM | POA: Diagnosis not present

## 2019-12-11 DIAGNOSIS — Z630 Problems in relationship with spouse or partner: Secondary | ICD-10-CM | POA: Diagnosis not present

## 2019-12-15 ENCOUNTER — Ambulatory Visit: Payer: BLUE CROSS/BLUE SHIELD

## 2020-01-01 ENCOUNTER — Ambulatory Visit: Payer: BLUE CROSS/BLUE SHIELD | Attending: Internal Medicine

## 2020-01-01 DIAGNOSIS — Z23 Encounter for immunization: Secondary | ICD-10-CM

## 2020-01-01 NOTE — Progress Notes (Signed)
   Covid-19 Vaccination Clinic  Name:  Alyssa Nichols    MRN: [id number] DOB: 1956/01/30  01/01/2020  [name] was observed post Covid-19 immunization for 15 minutes without incident. She was provided with Vaccine Information Sheet and instruction to access the V-Safe system.   [name] was instructed to call 911 with any severe reactions post vaccine: Marland Kitchen Difficulty breathing  . Swelling of face and throat  . A fast heartbeat  . A bad rash all over body  . Dizziness and weakness   Immunizations Administered    Name Date Dose VIS Date Route   Pfizer COVID-19 Vaccine 01/01/2020  2:34 PM 0.3 mL 09/07/2019 Intramuscular   Manufacturer: [employer], [employer]   Lot: [id number]   NDC: [address]-4

## 2020-01-08 DIAGNOSIS — Z630 Problems in relationship with spouse or partner: Secondary | ICD-10-CM | POA: Diagnosis not present

## 2020-01-08 DIAGNOSIS — F4323 Adjustment disorder with mixed anxiety and depressed mood: Secondary | ICD-10-CM | POA: Diagnosis not present

## 2020-01-15 DIAGNOSIS — F4323 Adjustment disorder with mixed anxiety and depressed mood: Secondary | ICD-10-CM | POA: Diagnosis not present

## 2020-01-15 DIAGNOSIS — Z630 Problems in relationship with spouse or partner: Secondary | ICD-10-CM | POA: Diagnosis not present

## 2020-01-23 DIAGNOSIS — F4323 Adjustment disorder with mixed anxiety and depressed mood: Secondary | ICD-10-CM | POA: Diagnosis not present

## 2020-01-23 DIAGNOSIS — Z630 Problems in relationship with spouse or partner: Secondary | ICD-10-CM | POA: Diagnosis not present

## 2020-01-30 DIAGNOSIS — F4323 Adjustment disorder with mixed anxiety and depressed mood: Secondary | ICD-10-CM | POA: Diagnosis not present

## 2020-01-30 DIAGNOSIS — Z630 Problems in relationship with spouse or partner: Secondary | ICD-10-CM | POA: Diagnosis not present

## 2020-02-06 DIAGNOSIS — Z630 Problems in relationship with spouse or partner: Secondary | ICD-10-CM | POA: Diagnosis not present

## 2020-02-06 DIAGNOSIS — F4323 Adjustment disorder with mixed anxiety and depressed mood: Secondary | ICD-10-CM | POA: Diagnosis not present

## 2020-02-12 DIAGNOSIS — Z630 Problems in relationship with spouse or partner: Secondary | ICD-10-CM | POA: Diagnosis not present

## 2020-02-12 DIAGNOSIS — F4323 Adjustment disorder with mixed anxiety and depressed mood: Secondary | ICD-10-CM | POA: Diagnosis not present

## 2020-02-20 DIAGNOSIS — Z630 Problems in relationship with spouse or partner: Secondary | ICD-10-CM | POA: Diagnosis not present

## 2020-02-20 DIAGNOSIS — F4323 Adjustment disorder with mixed anxiety and depressed mood: Secondary | ICD-10-CM | POA: Diagnosis not present

## 2020-02-27 DIAGNOSIS — Z630 Problems in relationship with spouse or partner: Secondary | ICD-10-CM | POA: Diagnosis not present

## 2020-02-27 DIAGNOSIS — F4323 Adjustment disorder with mixed anxiety and depressed mood: Secondary | ICD-10-CM | POA: Diagnosis not present

## 2020-03-11 DIAGNOSIS — Z630 Problems in relationship with spouse or partner: Secondary | ICD-10-CM | POA: Diagnosis not present

## 2020-03-11 DIAGNOSIS — F4323 Adjustment disorder with mixed anxiety and depressed mood: Secondary | ICD-10-CM | POA: Diagnosis not present

## 2020-03-13 DIAGNOSIS — H25813 Combined forms of age-related cataract, bilateral: Secondary | ICD-10-CM | POA: Diagnosis not present

## 2020-03-18 DIAGNOSIS — F4323 Adjustment disorder with mixed anxiety and depressed mood: Secondary | ICD-10-CM | POA: Diagnosis not present

## 2020-03-18 DIAGNOSIS — Z630 Problems in relationship with spouse or partner: Secondary | ICD-10-CM | POA: Diagnosis not present

## 2020-03-25 DIAGNOSIS — Z630 Problems in relationship with spouse or partner: Secondary | ICD-10-CM | POA: Diagnosis not present

## 2020-03-25 DIAGNOSIS — F4323 Adjustment disorder with mixed anxiety and depressed mood: Secondary | ICD-10-CM | POA: Diagnosis not present

## 2020-04-01 DIAGNOSIS — F4323 Adjustment disorder with mixed anxiety and depressed mood: Secondary | ICD-10-CM | POA: Diagnosis not present

## 2020-04-01 DIAGNOSIS — Z630 Problems in relationship with spouse or partner: Secondary | ICD-10-CM | POA: Diagnosis not present

## 2020-04-24 DIAGNOSIS — F4323 Adjustment disorder with mixed anxiety and depressed mood: Secondary | ICD-10-CM | POA: Diagnosis not present

## 2020-04-24 DIAGNOSIS — Z630 Problems in relationship with spouse or partner: Secondary | ICD-10-CM | POA: Diagnosis not present

## 2020-05-02 DIAGNOSIS — F3342 Major depressive disorder, recurrent, in full remission: Secondary | ICD-10-CM | POA: Diagnosis not present

## 2020-05-02 DIAGNOSIS — F900 Attention-deficit hyperactivity disorder, predominantly inattentive type: Secondary | ICD-10-CM | POA: Diagnosis not present

## 2020-05-15 DIAGNOSIS — Z630 Problems in relationship with spouse or partner: Secondary | ICD-10-CM | POA: Diagnosis not present

## 2020-05-15 DIAGNOSIS — F4323 Adjustment disorder with mixed anxiety and depressed mood: Secondary | ICD-10-CM | POA: Diagnosis not present

## 2020-05-21 DIAGNOSIS — Z630 Problems in relationship with spouse or partner: Secondary | ICD-10-CM | POA: Diagnosis not present

## 2020-05-21 DIAGNOSIS — F4323 Adjustment disorder with mixed anxiety and depressed mood: Secondary | ICD-10-CM | POA: Diagnosis not present

## 2020-06-12 DIAGNOSIS — F4323 Adjustment disorder with mixed anxiety and depressed mood: Secondary | ICD-10-CM | POA: Diagnosis not present

## 2020-06-12 DIAGNOSIS — Z630 Problems in relationship with spouse or partner: Secondary | ICD-10-CM | POA: Diagnosis not present

## 2020-06-24 DIAGNOSIS — Z630 Problems in relationship with spouse or partner: Secondary | ICD-10-CM | POA: Diagnosis not present

## 2020-06-24 DIAGNOSIS — F4323 Adjustment disorder with mixed anxiety and depressed mood: Secondary | ICD-10-CM | POA: Diagnosis not present

## 2020-06-26 DIAGNOSIS — Z79899 Other long term (current) drug therapy: Secondary | ICD-10-CM | POA: Diagnosis not present

## 2020-06-26 DIAGNOSIS — Z1152 Encounter for screening for COVID-19: Secondary | ICD-10-CM | POA: Diagnosis not present

## 2020-07-01 ENCOUNTER — Other Ambulatory Visit: Payer: Self-pay | Admitting: *Deleted

## 2020-07-01 DIAGNOSIS — Z1231 Encounter for screening mammogram for malignant neoplasm of breast: Secondary | ICD-10-CM

## 2020-07-03 ENCOUNTER — Other Ambulatory Visit: Payer: Self-pay

## 2020-07-03 ENCOUNTER — Ambulatory Visit
Admission: RE | Admit: 2020-07-03 | Discharge: 2020-07-03 | Disposition: A | Discharge status: Home / Self Care | Payer: Self-pay | Source: Ambulatory Visit | Attending: *Deleted | Admitting: *Deleted

## 2020-07-03 DIAGNOSIS — Z1231 Encounter for screening mammogram for malignant neoplasm of breast: Secondary | ICD-10-CM | POA: Diagnosis not present

## 2020-07-18 DIAGNOSIS — F4323 Adjustment disorder with mixed anxiety and depressed mood: Secondary | ICD-10-CM | POA: Diagnosis not present

## 2020-07-18 DIAGNOSIS — Z630 Problems in relationship with spouse or partner: Secondary | ICD-10-CM | POA: Diagnosis not present

## 2020-08-01 DIAGNOSIS — Z20822 Contact with and (suspected) exposure to covid-19: Secondary | ICD-10-CM | POA: Diagnosis not present

## 2020-08-05 DIAGNOSIS — Z630 Problems in relationship with spouse or partner: Secondary | ICD-10-CM | POA: Diagnosis not present

## 2020-08-05 DIAGNOSIS — F4323 Adjustment disorder with mixed anxiety and depressed mood: Secondary | ICD-10-CM | POA: Diagnosis not present

## 2020-08-12 DIAGNOSIS — F4323 Adjustment disorder with mixed anxiety and depressed mood: Secondary | ICD-10-CM | POA: Diagnosis not present

## 2020-08-12 DIAGNOSIS — Z630 Problems in relationship with spouse or partner: Secondary | ICD-10-CM | POA: Diagnosis not present

## 2020-08-15 DIAGNOSIS — R5381 Other malaise: Secondary | ICD-10-CM | POA: Diagnosis not present

## 2020-08-15 DIAGNOSIS — R5383 Other fatigue: Secondary | ICD-10-CM | POA: Diagnosis not present

## 2020-08-19 DIAGNOSIS — F4323 Adjustment disorder with mixed anxiety and depressed mood: Secondary | ICD-10-CM | POA: Diagnosis not present

## 2020-08-19 DIAGNOSIS — Z630 Problems in relationship with spouse or partner: Secondary | ICD-10-CM | POA: Diagnosis not present

## 2020-08-26 DIAGNOSIS — F4323 Adjustment disorder with mixed anxiety and depressed mood: Secondary | ICD-10-CM | POA: Diagnosis not present

## 2020-08-26 DIAGNOSIS — Z630 Problems in relationship with spouse or partner: Secondary | ICD-10-CM | POA: Diagnosis not present

## 2020-08-30 DIAGNOSIS — F4321 Adjustment disorder with depressed mood: Secondary | ICD-10-CM | POA: Diagnosis not present

## 2020-09-02 ENCOUNTER — Other Ambulatory Visit: Payer: Self-pay | Admitting: Internal Medicine

## 2020-09-02 DIAGNOSIS — M8580 Other specified disorders of bone density and structure, unspecified site: Secondary | ICD-10-CM

## 2020-10-14 DIAGNOSIS — H2513 Age-related nuclear cataract, bilateral: Secondary | ICD-10-CM | POA: Diagnosis not present

## 2020-10-14 DIAGNOSIS — H5213 Myopia, bilateral: Secondary | ICD-10-CM | POA: Diagnosis not present

## 2020-10-14 DIAGNOSIS — H25819 Combined forms of age-related cataract, unspecified eye: Secondary | ICD-10-CM | POA: Diagnosis not present

## 2020-10-14 DIAGNOSIS — H52223 Regular astigmatism, bilateral: Secondary | ICD-10-CM | POA: Diagnosis not present

## 2020-12-10 ENCOUNTER — Ambulatory Visit
Admission: RE | Admit: 2020-12-10 | Discharge: 2020-12-10 | Disposition: A | Discharge status: Home / Self Care | Payer: BC Managed Care – PPO | Source: Ambulatory Visit | Attending: Internal Medicine | Admitting: Internal Medicine

## 2020-12-10 ENCOUNTER — Other Ambulatory Visit: Payer: Self-pay

## 2020-12-10 DIAGNOSIS — M8589 Other specified disorders of bone density and structure, multiple sites: Secondary | ICD-10-CM | POA: Diagnosis not present

## 2020-12-10 DIAGNOSIS — M8580 Other specified disorders of bone density and structure, unspecified site: Secondary | ICD-10-CM

## 2020-12-10 DIAGNOSIS — M810 Age-related osteoporosis without current pathological fracture: Secondary | ICD-10-CM | POA: Diagnosis not present

## 2020-12-10 DIAGNOSIS — Z780 Asymptomatic menopausal state: Secondary | ICD-10-CM | POA: Diagnosis not present

## 2021-01-07 DIAGNOSIS — M67472 Ganglion, left ankle and foot: Secondary | ICD-10-CM | POA: Diagnosis not present

## 2021-01-07 DIAGNOSIS — M205X2 Other deformities of toe(s) (acquired), left foot: Secondary | ICD-10-CM | POA: Diagnosis not present

## 2021-01-07 DIAGNOSIS — M19072 Primary osteoarthritis, left ankle and foot: Secondary | ICD-10-CM | POA: Diagnosis not present

## 2021-01-07 DIAGNOSIS — M19071 Primary osteoarthritis, right ankle and foot: Secondary | ICD-10-CM | POA: Diagnosis not present

## 2021-02-09 DIAGNOSIS — Z20822 Contact with and (suspected) exposure to covid-19: Secondary | ICD-10-CM | POA: Diagnosis not present

## 2021-04-21 DIAGNOSIS — H524 Presbyopia: Secondary | ICD-10-CM | POA: Diagnosis not present

## 2021-04-21 DIAGNOSIS — H5213 Myopia, bilateral: Secondary | ICD-10-CM | POA: Diagnosis not present

## 2021-06-04 ENCOUNTER — Other Ambulatory Visit: Payer: Self-pay | Admitting: Internal Medicine

## 2021-06-04 DIAGNOSIS — Z1231 Encounter for screening mammogram for malignant neoplasm of breast: Secondary | ICD-10-CM

## 2021-06-16 DIAGNOSIS — U071 COVID-19: Secondary | ICD-10-CM | POA: Diagnosis not present

## 2021-07-13 ENCOUNTER — Ambulatory Visit
Admission: RE | Admit: 2021-07-13 | Discharge: 2021-07-13 | Disposition: A | Discharge status: Home / Self Care | Payer: Medicare Other | Source: Ambulatory Visit

## 2021-07-13 DIAGNOSIS — Z1231 Encounter for screening mammogram for malignant neoplasm of breast: Secondary | ICD-10-CM | POA: Diagnosis not present

## 2021-08-06 DIAGNOSIS — E785 Hyperlipidemia, unspecified: Secondary | ICD-10-CM | POA: Diagnosis not present

## 2021-08-06 DIAGNOSIS — M810 Age-related osteoporosis without current pathological fracture: Secondary | ICD-10-CM | POA: Diagnosis not present

## 2021-08-13 DIAGNOSIS — F900 Attention-deficit hyperactivity disorder, predominantly inattentive type: Secondary | ICD-10-CM | POA: Diagnosis not present

## 2021-08-13 DIAGNOSIS — E785 Hyperlipidemia, unspecified: Secondary | ICD-10-CM | POA: Diagnosis not present

## 2021-08-13 DIAGNOSIS — M810 Age-related osteoporosis without current pathological fracture: Secondary | ICD-10-CM | POA: Diagnosis not present

## 2021-08-13 DIAGNOSIS — Z0000 Encounter for general adult medical examination without abnormal findings: Secondary | ICD-10-CM | POA: Diagnosis not present

## 2021-10-10 ENCOUNTER — Encounter: Payer: Self-pay | Admitting: Gastroenterology

## 2021-10-21 IMAGING — MG MM DIGITAL SCREENING BILAT W/ TOMO AND CAD
8 series · 9 of 24 positions shown · non-contrast
Comparison: Previous exam(s).

CLINICAL DATA: Screening.

EXAM:
DIGITAL SCREENING BILATERAL MAMMOGRAM WITH TOMOSYNTHESIS AND CAD
TECHNIQUE: Bilateral screening digital craniocaudal and mediolateral oblique
mammograms were obtained. Bilateral screening digital breast
tomosynthesis was performed. The images were evaluated with
computer-aided detection.

[L MLO synth-2D]
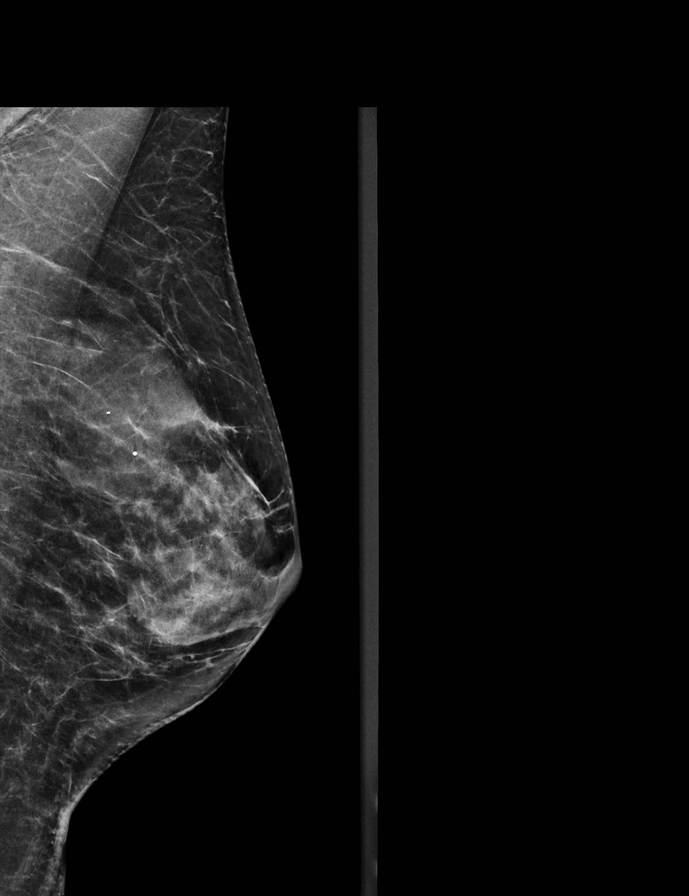

[R CC synth-2D]
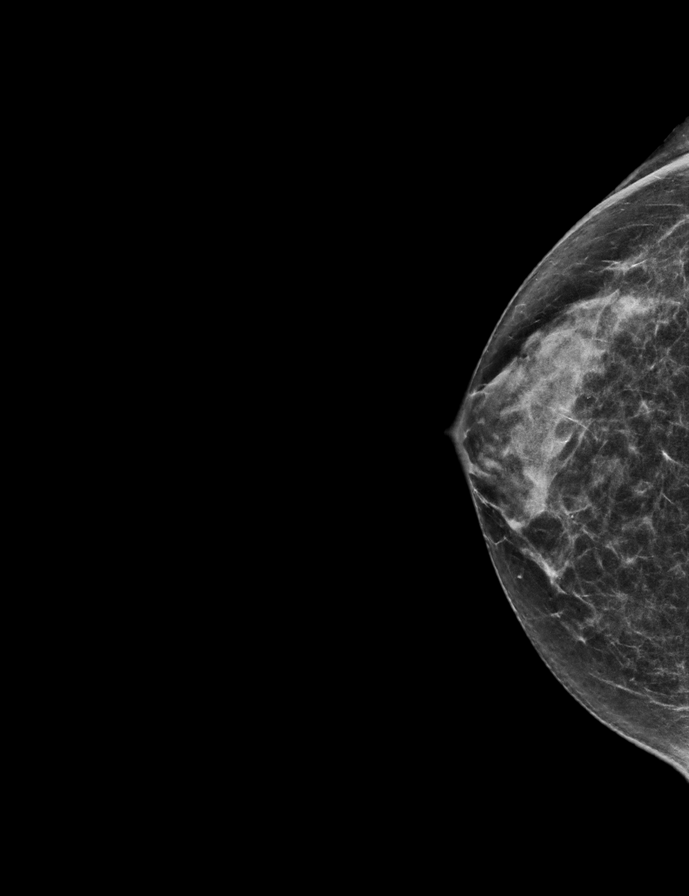

[R MLO synth-2D]
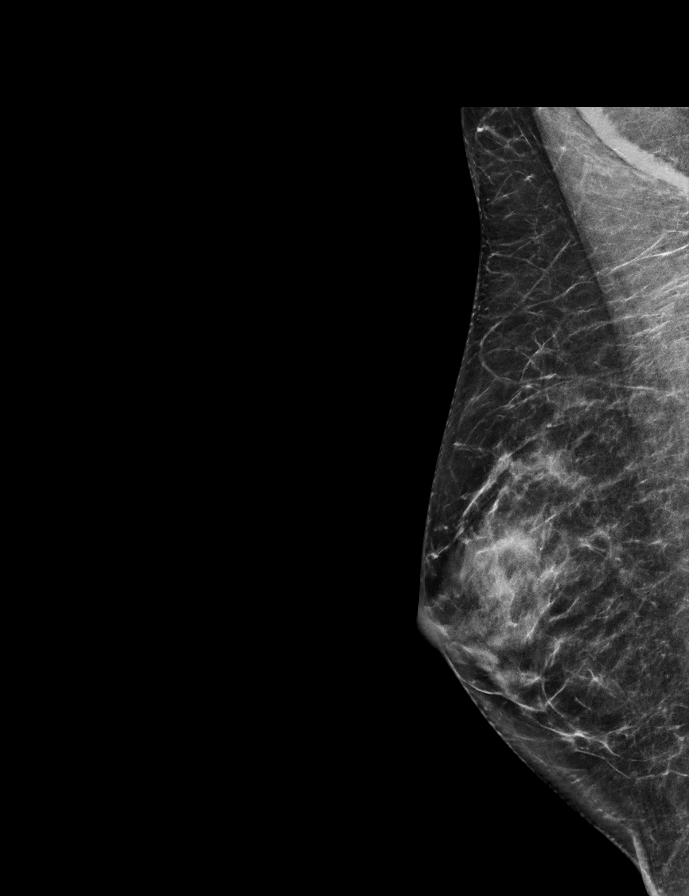

[L CC synth-2D]
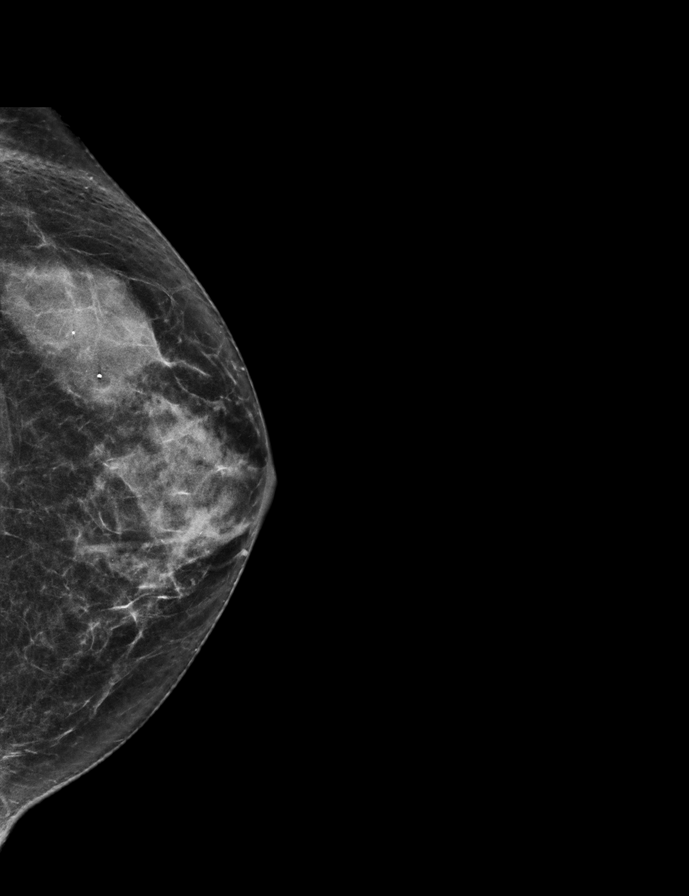

[R MLO tomo · 2 of 62 frames shown]
[frame 21/62]
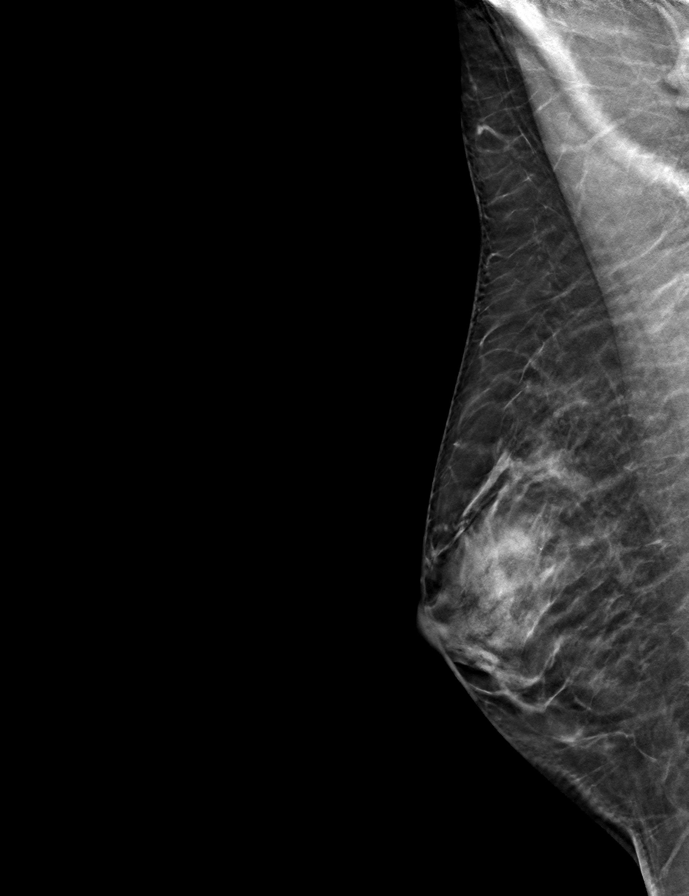
[frame 31/62]
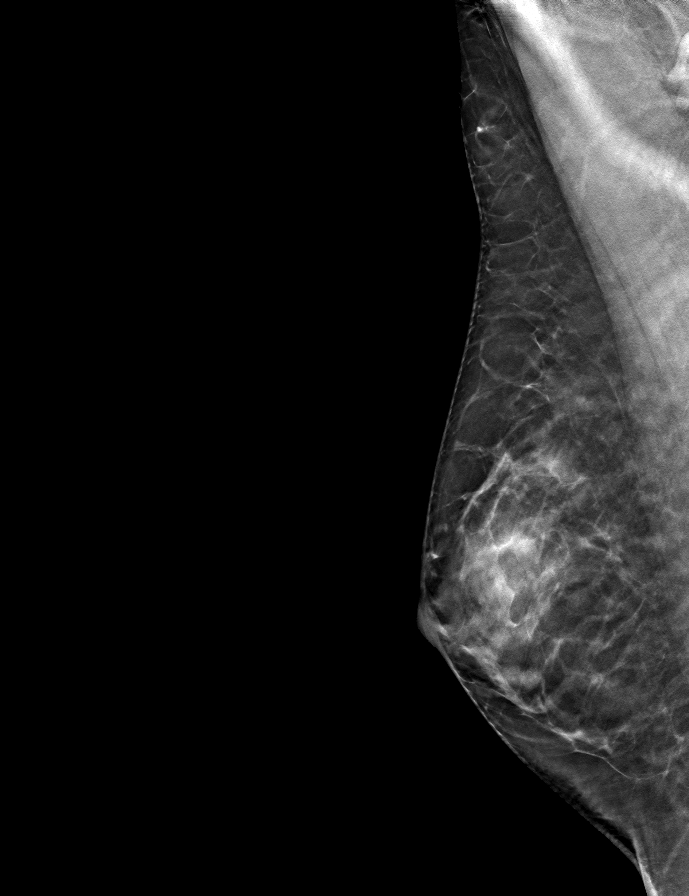

[L CC tomo · tomo slice 29/58.0]
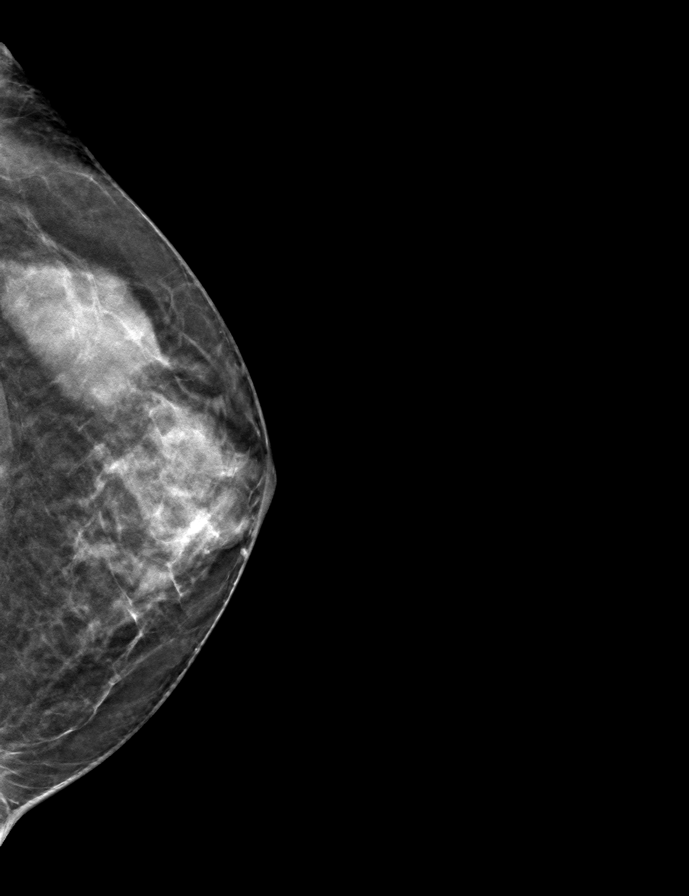

[R CC tomo · tomo slice 29/58.0]
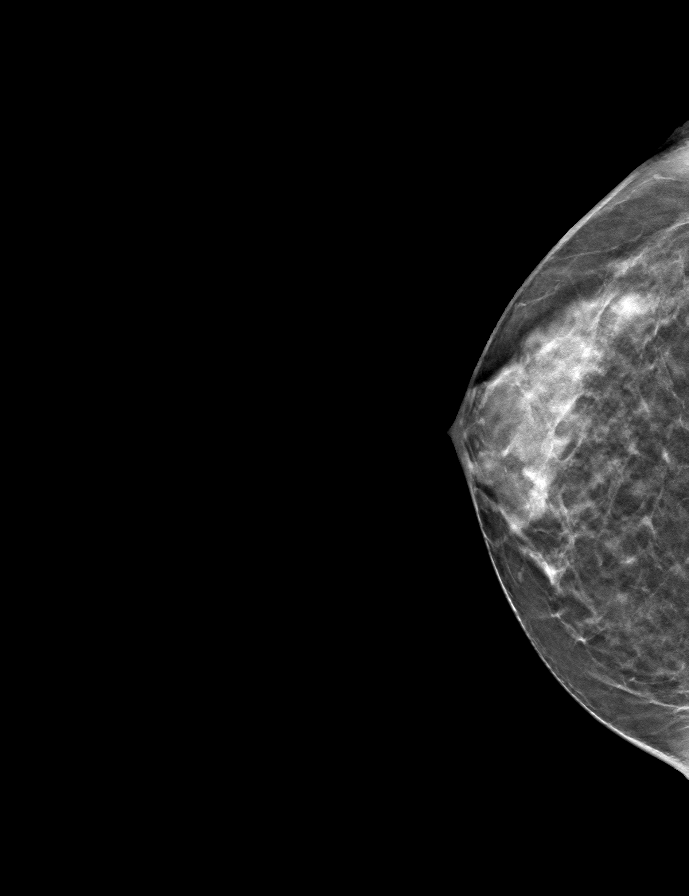

[L MLO tomo · tomo slice 30/59.0]
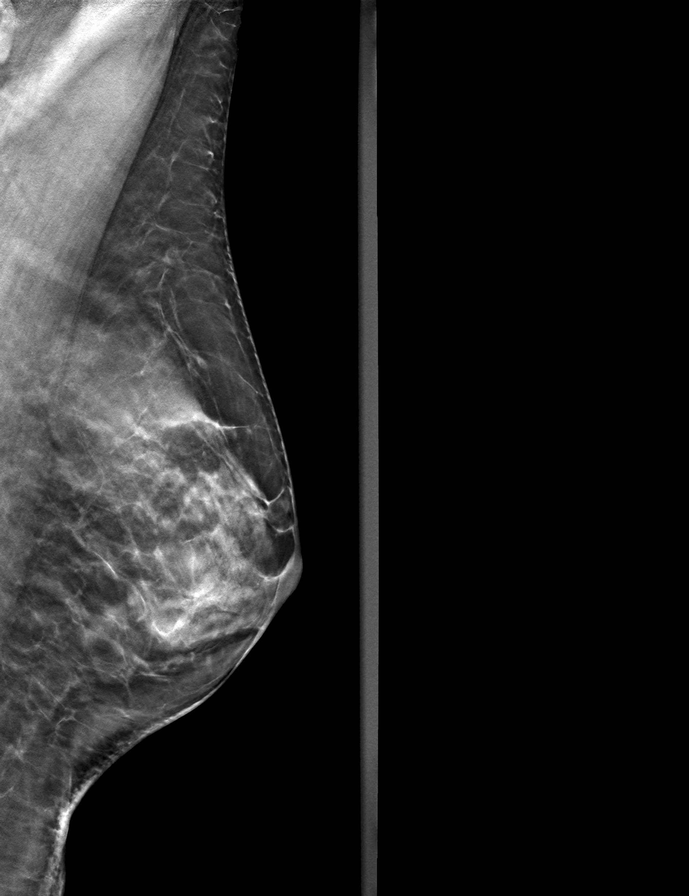

[9 of 24 positions shown; findings below may reference images not displayed]

ACR Breast Density Category c: The breast tissue is heterogeneously
dense, which may obscure small masses.
FINDINGS: There are no findings suspicious for malignancy.
IMPRESSION: No mammographic evidence of malignancy. A result letter of this
screening mammogram will be mailed directly to the patient.

RECOMMENDATION:
Screening mammogram in one year. (Code:Q3-W-BC3)

BI-RADS CATEGORY  1: Negative.

## 2022-05-10 DIAGNOSIS — M2041 Other hammer toe(s) (acquired), right foot: Secondary | ICD-10-CM | POA: Diagnosis not present

## 2022-05-10 DIAGNOSIS — M79671 Pain in right foot: Secondary | ICD-10-CM | POA: Diagnosis not present

## 2022-05-10 DIAGNOSIS — M19072 Primary osteoarthritis, left ankle and foot: Secondary | ICD-10-CM | POA: Diagnosis not present

## 2022-05-11 DIAGNOSIS — H5213 Myopia, bilateral: Secondary | ICD-10-CM | POA: Diagnosis not present

## 2022-07-06 ENCOUNTER — Other Ambulatory Visit: Payer: Self-pay | Admitting: Internal Medicine

## 2022-07-06 DIAGNOSIS — Z1231 Encounter for screening mammogram for malignant neoplasm of breast: Secondary | ICD-10-CM

## 2022-07-20 DIAGNOSIS — H25043 Posterior subcapsular polar age-related cataract, bilateral: Secondary | ICD-10-CM | POA: Diagnosis not present

## 2022-07-20 DIAGNOSIS — H18413 Arcus senilis, bilateral: Secondary | ICD-10-CM | POA: Diagnosis not present

## 2022-07-20 DIAGNOSIS — H25013 Cortical age-related cataract, bilateral: Secondary | ICD-10-CM | POA: Diagnosis not present

## 2022-07-20 DIAGNOSIS — H2513 Age-related nuclear cataract, bilateral: Secondary | ICD-10-CM | POA: Diagnosis not present

## 2022-08-10 ENCOUNTER — Ambulatory Visit
Admission: RE | Admit: 2022-08-10 | Discharge: 2022-08-10 | Disposition: A | Discharge status: Home / Self Care | Payer: Medicare Other | Source: Ambulatory Visit | Attending: Internal Medicine | Admitting: Internal Medicine

## 2022-08-10 DIAGNOSIS — Z1231 Encounter for screening mammogram for malignant neoplasm of breast: Secondary | ICD-10-CM

## 2022-08-26 DIAGNOSIS — M810 Age-related osteoporosis without current pathological fracture: Secondary | ICD-10-CM | POA: Diagnosis not present

## 2022-08-26 DIAGNOSIS — E785 Hyperlipidemia, unspecified: Secondary | ICD-10-CM | POA: Diagnosis not present

## 2022-08-26 DIAGNOSIS — R7989 Other specified abnormal findings of blood chemistry: Secondary | ICD-10-CM | POA: Diagnosis not present

## 2022-09-02 DIAGNOSIS — M810 Age-related osteoporosis without current pathological fracture: Secondary | ICD-10-CM | POA: Diagnosis not present

## 2022-09-02 DIAGNOSIS — Z0000 Encounter for general adult medical examination without abnormal findings: Secondary | ICD-10-CM | POA: Diagnosis not present

## 2022-09-02 DIAGNOSIS — R030 Elevated blood-pressure reading, without diagnosis of hypertension: Secondary | ICD-10-CM | POA: Diagnosis not present

## 2022-09-02 DIAGNOSIS — E785 Hyperlipidemia, unspecified: Secondary | ICD-10-CM | POA: Diagnosis not present

## 2022-09-10 DIAGNOSIS — H2511 Age-related nuclear cataract, right eye: Secondary | ICD-10-CM | POA: Diagnosis not present

## 2022-09-10 DIAGNOSIS — H2512 Age-related nuclear cataract, left eye: Secondary | ICD-10-CM | POA: Diagnosis not present

## 2022-10-01 DIAGNOSIS — H2511 Age-related nuclear cataract, right eye: Secondary | ICD-10-CM | POA: Diagnosis not present

## 2022-12-09 DIAGNOSIS — H524 Presbyopia: Secondary | ICD-10-CM | POA: Diagnosis not present

## 2023-03-24 DIAGNOSIS — L700 Acne vulgaris: Secondary | ICD-10-CM | POA: Diagnosis not present

## 2023-03-24 DIAGNOSIS — D2239 Melanocytic nevi of other parts of face: Secondary | ICD-10-CM | POA: Diagnosis not present

## 2023-03-24 DIAGNOSIS — L710 Perioral dermatitis: Secondary | ICD-10-CM | POA: Diagnosis not present

## 2023-03-24 DIAGNOSIS — D2261 Melanocytic nevi of right upper limb, including shoulder: Secondary | ICD-10-CM | POA: Diagnosis not present

## 2023-06-15 DIAGNOSIS — H524 Presbyopia: Secondary | ICD-10-CM | POA: Diagnosis not present

## 2023-10-21 ENCOUNTER — Other Ambulatory Visit: Payer: Self-pay | Admitting: Internal Medicine

## 2023-10-21 DIAGNOSIS — Z1231 Encounter for screening mammogram for malignant neoplasm of breast: Secondary | ICD-10-CM

## 2023-11-02 ENCOUNTER — Ambulatory Visit
Admission: RE | Admit: 2023-11-02 | Discharge: 2023-11-02 | Disposition: A | Discharge status: Home / Self Care | Payer: Medicare Other | Source: Ambulatory Visit | Attending: Internal Medicine | Admitting: Internal Medicine

## 2023-11-02 DIAGNOSIS — Z1231 Encounter for screening mammogram for malignant neoplasm of breast: Secondary | ICD-10-CM | POA: Diagnosis not present

## 2024-02-20 DIAGNOSIS — R5383 Other fatigue: Secondary | ICD-10-CM | POA: Diagnosis not present

## 2024-02-20 DIAGNOSIS — J029 Acute pharyngitis, unspecified: Secondary | ICD-10-CM | POA: Diagnosis not present

## 2024-04-10 DIAGNOSIS — H5202 Hypermetropia, left eye: Secondary | ICD-10-CM | POA: Diagnosis not present

## 2024-07-26 DIAGNOSIS — Z1211 Encounter for screening for malignant neoplasm of colon: Secondary | ICD-10-CM | POA: Diagnosis not present
